# Patient Record
Sex: Male | Born: 1975 | Race: Black or African American | Hispanic: No | Marital: Single | Smoking: Never smoker
Health system: Southern US, Community
[De-identification: ages and names within clinical notes are randomized; demographics above are authoritative.]

## PROBLEM LIST (undated history)

## (undated) DIAGNOSIS — I10 Essential (primary) hypertension: Secondary | ICD-10-CM

---

## 2016-12-17 ENCOUNTER — Encounter (HOSPITAL_COMMUNITY): Payer: Self-pay | Admitting: Family Medicine

## 2016-12-17 ENCOUNTER — Ambulatory Visit (HOSPITAL_COMMUNITY)
Admission: EM | Admit: 2016-12-17 | Discharge: 2016-12-17 | Disposition: A | Discharge status: Home / Self Care | Payer: Self-pay | Attending: Family Medicine | Admitting: Family Medicine

## 2016-12-17 DIAGNOSIS — J069 Acute upper respiratory infection, unspecified: Secondary | ICD-10-CM

## 2016-12-17 DIAGNOSIS — B9789 Other viral agents as the cause of diseases classified elsewhere: Secondary | ICD-10-CM

## 2016-12-17 DIAGNOSIS — K2900 Acute gastritis without bleeding: Secondary | ICD-10-CM

## 2016-12-17 HISTORY — DX: Essential (primary) hypertension: I10

## 2016-12-17 MED ORDER — LOPERAMIDE HCL 2 MG PO CAPS: 2.0000 mg | ORAL_CAPSULE | Freq: Four times a day (QID) | ORAL | 0 refills | Status: AC | PRN

## 2016-12-17 MED ORDER — BENZONATATE 100 MG PO CAPS: 100.0000 mg | ORAL_CAPSULE | Freq: Three times a day (TID) | ORAL | 0 refills | Status: DC

## 2016-12-17 MED ORDER — IPRATROPIUM BROMIDE 0.06 % NA SOLN: 2.0000 | Freq: Four times a day (QID) | NASAL | 12 refills | Status: AC

## 2016-12-17 MED ORDER — ONDANSETRON 4 MG PO TBDP: 4.0000 mg | ORAL_TABLET | Freq: Three times a day (TID) | ORAL | 0 refills | Status: AC | PRN

## 2016-12-17 NOTE — Discharge Instructions (Signed)
You most likely have a viral URI, I advise rest, plenty of fluids and management of symptoms with over the counter medicines. For symptoms you may take Tylenol as needed every 4-6 hours for body aches or fever, not to exceed 4,000 mg a day, Take mucinex or mucinex DM ever 12 hours with a full glass of water, you may use an inhaled steroid such as Flonase, 2 sprays each nostril once a day for congestion, or an antihistamine such as Claritin or Zyrtec once a day.   To help with your symptoms, I have prescribed loperamide for diarrhea, take 1 tablet after each loose stool, not to exceed 4 in any 24 hour period. For Nausea, I have prescribed Zofran, take 1 tablet under the tongue every 8 hours as needed. For cough, I have prescribed a medication called Tessalon. Take 1 tablet every 8 hours as needed for your cough. For runny nose and congestion I have prescribed ipratropium nasal spray, use 2 sprays in each nostril up to 4 times a day as needed.  Should your symptoms worsen or fail to resolve, follow up with your primary care provider or return to clinic.

## 2016-12-17 NOTE — ED Provider Notes (Signed)
CSN: [id number]     Arrival date & time 12/17/16  1000 History   First MD Initiated Contact with Patient 12/17/16 1057     Chief Complaint  Patient presents with  . Cough  . Emesis   (Consider location/radiation/quality/duration/timing/severity/associated sxs/prior Treatment) 41 year old male presents to clinic with chief complaint of cough, congestion, runny nose, nausea, vomiting, and diarrhea. Describes his cough as being a dry, hacking, nonproductive, with clear sputum present for 2 days. Denies history of lung disease such as asthma, COPD, or emphysema. He recently quit smoking approximately 7 days ago. Had previous smoking history of 3 months of one pack per day.  With regard to nausea, vomiting, diarrhea he denies any abdominal pain, reports diarrhea 3-4 episodes this morning, as well as 4-5 episodes yesterday. He describes stools as water-like, no evidence of blood present. He states his vomiting seems to be triggered by his cough, along with some mucus. He reports he still has both his gallbladder, and his appendix. He states his symptoms of nausea and vomiting are unrelated to diet.   The history is provided by the patient.  Cough  Emesis  Associated symptoms: cough     Past Medical History:  Diagnosis Date  . Hypertension    History reviewed. No pertinent surgical history. History reviewed. No pertinent family history. Social History  Substance Use Topics  . Smoking status: Never Smoker  . Smokeless tobacco: Never Used  . Alcohol use Not on file    Review of Systems  Reason unable to perform ROS: as covered in HPI.  Respiratory: Positive for cough.   Gastrointestinal: Positive for vomiting.  All other systems reviewed and are negative.   Allergies  Ibuprofen  Home Medications   Prior to Admission medications   Medication Sig Start Date End Date Taking? Authorizing Provider  benzonatate (TESSALON) 100 MG capsule Take 1 capsule (100 mg total) by mouth every 8  (eight) hours. 12/17/16   Dorena Bodo, NP  ipratropium (ATROVENT) 0.06 % nasal spray Place 2 sprays into both nostrils 4 (four) times daily. 12/17/16   Dorena Bodo, NP  loperamide (IMODIUM) 2 MG capsule Take 1 capsule (2 mg total) by mouth 4 (four) times daily as needed for diarrhea or loose stools. 12/17/16   Dorena Bodo, NP  ondansetron (ZOFRAN ODT) 4 MG disintegrating tablet Take 1 tablet (4 mg total) by mouth every 8 (eight) hours as needed for nausea or vomiting. 12/17/16   Dorena Bodo, NP   Meds Ordered and Administered this Visit  Medications - No data to display  BP 133/81   Pulse 107   Temp 98.7 F (37.1 C)   Resp 18   SpO2 100%  No data found.   Physical Exam  Constitutional: He is oriented to person, place, and time. He appears well-developed and well-nourished. He appears ill. No distress.  HENT:  Head: Normocephalic and atraumatic.  Right Ear: Tympanic membrane and external ear normal.  Left Ear: Tympanic membrane and external ear normal.  Nose: Nose normal. Right sinus exhibits no maxillary sinus tenderness and no frontal sinus tenderness. Left sinus exhibits no maxillary sinus tenderness and no frontal sinus tenderness.  Mouth/Throat: Uvula is midline, oropharynx is clear and moist and mucous membranes are normal. No oropharyngeal exudate. Tonsils are 1+ on the right. Tonsils are 1+ on the left. No tonsillar exudate.  Eyes: Pupils are equal, round, and reactive to light.  Neck: Normal range of motion. Neck supple. No JVD present.  Cardiovascular: Normal rate and  regular rhythm.   Pulmonary/Chest: Effort normal and breath sounds normal. No respiratory distress. He has no wheezes. He has no rhonchi.  Abdominal: Soft. Bowel sounds are normal. There is no hepatosplenomegaly. There is no tenderness. There is no rigidity, no rebound, no guarding, no CVA tenderness, no tenderness at McBurney's point and negative Murphy's sign.  Lymphadenopathy:       Head (right  side): No submental, no submandibular, no tonsillar and no preauricular adenopathy present.       Head (left side): No submental, no submandibular, no tonsillar and no preauricular adenopathy present.    He has no cervical adenopathy.  Neurological: He is alert and oriented to person, place, and time.  Skin: Skin is warm and dry. Capillary refill takes less than 2 seconds. No rash noted. He is not diaphoretic. No erythema.  Psychiatric: He has a normal mood and affect.  Nursing note and vitals reviewed.   Urgent Care Course     Procedures (including critical care time)  Labs Review Labs Reviewed - No data to display  Imaging Review No results found.   Visual Acuity Review  Right Eye Distance:   Left Eye Distance:   Bilateral Distance:    Right Eye Near:   Left Eye Near:    Bilateral Near:         MDM   1. Viral URI with cough   2. Acute gastritis without hemorrhage, unspecified gastritis type    You most likely have a viral URI, I advise rest, plenty of fluids and management of symptoms with over the counter medicines. For symptoms you may take Tylenol as needed every 4-6 hours for body aches or fever, not to exceed 4,000 mg a day, Take mucinex or mucinex DM ever 12 hours with a full glass of water, you may use an inhaled steroid such as Flonase, 2 sprays each nostril once a day for congestion, or an antihistamine such as Claritin or Zyrtec once a day.   To help with your symptoms, I have prescribed loperamide for diarrhea, take 1 tablet after each loose stool, not to exceed 4 in any 24 hour period. For Nausea, I have prescribed Zofran, take 1 tablet under the tongue every 8 hours as needed. For cough, I have prescribed a medication called Tessalon. Take 1 tablet every 8 hours as needed for your cough. For runny nose and congestion I have prescribed ipratropium nasal spray, use 2 sprays in each nostril up to 4 times a day as needed.  Should your symptoms worsen or fail to  resolve, follow up with your primary care provider or return to clinic.       Dorena BodoLawrence Judithe Keetch, NP 12/17/16 1124

## 2016-12-17 NOTE — ED Triage Notes (Signed)
Pt here for cough since Sunday that he treated with mucinex. sts started vomiting and slight diarrhea. sts cold sweats.

## 2020-03-20 ENCOUNTER — Encounter (HOSPITAL_COMMUNITY): Payer: Self-pay

## 2020-03-20 ENCOUNTER — Ambulatory Visit (HOSPITAL_COMMUNITY)
Admission: EM | Admit: 2020-03-20 | Discharge: 2020-03-20 | Disposition: A | Discharge status: Home / Self Care | Payer: Self-pay | Attending: Family Medicine | Admitting: Family Medicine

## 2020-03-20 ENCOUNTER — Other Ambulatory Visit: Payer: Self-pay

## 2020-03-20 DIAGNOSIS — H66002 Acute suppurative otitis media without spontaneous rupture of ear drum, left ear: Secondary | ICD-10-CM

## 2020-03-20 DIAGNOSIS — H6982 Other specified disorders of Eustachian tube, left ear: Secondary | ICD-10-CM

## 2020-03-20 MED ORDER — AMOXICILLIN 500 MG PO CAPS: 500.0000 mg | ORAL_CAPSULE | Freq: Three times a day (TID) | ORAL | 0 refills | Status: AC

## 2020-03-20 MED ORDER — FLUTICASONE PROPIONATE 50 MCG/ACT NA SUSP: 1.0000 | Freq: Every day | NASAL | 0 refills | Status: DC

## 2020-03-20 NOTE — ED Triage Notes (Signed)
Pt presents with left ear fullness X 3 days. 

## 2020-03-20 NOTE — Discharge Instructions (Signed)
Begin amoxicillin for the next week to cover infection. Flonase nasal spray 1 to 2 spray in each nostril daily over the next 1 to 2 weeks to help with draining fluid off of ears contributing to sensation  Please follow-up if any symptoms not improving or worsening

## 2020-03-20 NOTE — ED Provider Notes (Addendum)
[facility]    CSN: [id number] Arrival date & time: 03/20/20  1034      History   Chief Complaint Chief Complaint  Patient presents with  . Ear Fullness    HPI David Huber is a 44 y.o. male history of hypertension presenting today for evaluation of left ear fullness.  Patient notes over the past 3 days he has had decreased hearing and a muffled sensation in his left ear.  He has had some slight discomfort with this with associated mouth sore throat.  Reports history of similar with needing earwax irrigation.  Denies fevers.  Used Mucinex earlier today.  HPI  Past Medical History:  Diagnosis Date  . Hypertension     There are no problems to display for this patient.   History reviewed. No pertinent surgical history.     Home Medications    Prior to Admission medications   Medication Sig Start Date End Date Taking? Authorizing Provider  amoxicillin (AMOXIL) 500 MG capsule Take 1 capsule (500 mg total) by mouth 3 (three) times daily for 7 days. 03/20/20 03/27/20  Coleson Kant C, PA-C  benzonatate (TESSALON) 100 MG capsule Take 1 capsule (100 mg total) by mouth every 8 (eight) hours. 12/17/16   Dorena Bodo, NP  fluticasone (FLONASE) 50 MCG/ACT nasal spray Place 1-2 sprays into both nostrils daily for 7 days. 03/20/20 03/27/20  Keymoni Mccaster C, PA-C  ipratropium (ATROVENT) 0.06 % nasal spray Place 2 sprays into both nostrils 4 (four) times daily. 12/17/16   Dorena Bodo, NP  loperamide (IMODIUM) 2 MG capsule Take 1 capsule (2 mg total) by mouth 4 (four) times daily as needed for diarrhea or loose stools. 12/17/16   Dorena Bodo, NP  ondansetron (ZOFRAN ODT) 4 MG disintegrating tablet Take 1 tablet (4 mg total) by mouth every 8 (eight) hours as needed for nausea or vomiting. 12/17/16   Dorena Bodo, NP    Family History Family History  Family history unknown: Yes    Social History Social History   Tobacco Use  . Smoking status:  Never Smoker  . Smokeless tobacco: Never Used  Substance Use Topics  . Alcohol use: Not on file  . Drug use: Not on file     Allergies   Ibuprofen   Review of Systems Review of Systems  Constitutional: Negative for activity change, appetite change, chills, fatigue and fever.  HENT: Positive for ear pain, hearing loss and sore throat. Negative for congestion, rhinorrhea, sinus pressure and trouble swallowing.   Eyes: Negative for discharge and redness.  Respiratory: Negative for cough, chest tightness and shortness of breath.   Cardiovascular: Negative for chest pain.  Gastrointestinal: Negative for abdominal pain, diarrhea, nausea and vomiting.  Musculoskeletal: Negative for myalgias.  Skin: Negative for rash.  Neurological: Negative for dizziness, light-headedness and headaches.     Physical Exam Triage Vital Signs ED Triage Vitals [03/20/20 1133]  Enc Vitals Group     BP      Pulse      Resp      Temp      Temp src      SpO2      Weight      Height      Head Circumference      Peak Flow      Pain Score 0     Pain Loc      Pain Edu?      Excl. in GC?    No data found.  Updated Vital Signs BP (!) 136/95 (BP Location: Right Arm)   Pulse 93   Temp 98.7 F (37.1 C) (Oral)   Resp 18   SpO2 96%   Visual Acuity Right Eye Distance:   Left Eye Distance:   Bilateral Distance:    Right Eye Near:   Left Eye Near:    Bilateral Near:     Physical Exam Vitals and nursing note reviewed.  Constitutional:      Appearance: He is well-developed.     Comments: No acute distress  HENT:     Head: Normocephalic and atraumatic.     Ears:     Comments: Bilateral canals clear without cerumen, no edema or erythema, left TM appears erythematous, slightly dull with appearance of yellowish fluid behind ear Right TM appears slightly opaque    Nose: Nose normal.     Comments: Nasal mucosa mildly erythematous, nonswollen turbinates    Mouth/Throat:     Comments: Oral  mucosa pink and moist, no tonsillar enlargement or exudate. Posterior pharynx patent and nonerythematous, no uvula deviation or swelling. Normal phonation. Eyes:     Conjunctiva/sclera: Conjunctivae normal.  Cardiovascular:     Rate and Rhythm: Normal rate.  Pulmonary:     Effort: Pulmonary effort is normal. No respiratory distress.     Comments: Breathing comfortably at rest, CTABL, no wheezing, rales or other adventitious sounds auscultated Abdominal:     General: There is no distension.  Musculoskeletal:        General: Normal range of motion.     Cervical back: Neck supple.  Skin:    General: Skin is warm and dry.  Neurological:     Mental Status: He is alert and oriented to person, place, and time.      UC Treatments / Results  Labs (all labs ordered are listed, but only abnormal results are displayed) Labs Reviewed - No data to display  EKG   Radiology No results found.  Procedures Procedures (including critical care time)  Medications Ordered in UC Medications - No data to display  Initial Impression / Assessment and Plan / UC Course  I have reviewed the triage vital signs and the nursing notes.  Pertinent labs & imaging results that were available during my care of the patient were reviewed by me and considered in my medical decision making (see chart for details).     Treating for eustachian dysfunction with likely secondary otitis media developing.  Treating with amoxicillin and Flonase.  No wax needing removal at this point.  Monitor for improvement in symptoms.  Discussed strict return precautions. Patient verbalized understanding and is agreeable with plan.  Final Clinical Impressions(s) / UC Diagnoses   Final diagnoses:  Non-recurrent acute suppurative otitis media of left ear without spontaneous rupture of tympanic membrane  Eustachian tube dysfunction, left     Discharge Instructions     Begin amoxicillin for the next week to cover  infection. Flonase nasal spray 1 to 2 spray in each nostril daily over the next 1 to 2 weeks to help with draining fluid off of ears contributing to sensation  Please follow-up if any symptoms not improving or worsening    ED Prescriptions    Medication Sig Dispense Auth. Provider   amoxicillin (AMOXIL) 500 MG capsule Take 1 capsule (500 mg total) by mouth 3 (three) times daily for 7 days. 21 capsule Anthonio Mizzell C, PA-C   fluticasone (FLONASE) 50 MCG/ACT nasal spray Place 1-2 sprays into both nostrils daily  for 7 days. 1 g Lakethia Coppess, [place] C, PA-C     PDMP not reviewed this encounter.   Lew Dawes, PA-C 03/20/20 [address] [place] C, PA-C 03/20/20 1152

## 2020-12-20 ENCOUNTER — Encounter (HOSPITAL_COMMUNITY): Payer: Self-pay

## 2020-12-20 ENCOUNTER — Ambulatory Visit (HOSPITAL_COMMUNITY)
Admission: EM | Admit: 2020-12-20 | Discharge: 2020-12-20 | Disposition: A | Discharge status: Home / Self Care | Payer: Self-pay | Attending: Family Medicine | Admitting: Family Medicine

## 2020-12-20 ENCOUNTER — Ambulatory Visit (INDEPENDENT_AMBULATORY_CARE_PROVIDER_SITE_OTHER): Payer: Self-pay

## 2020-12-20 ENCOUNTER — Other Ambulatory Visit: Payer: Self-pay

## 2020-12-20 DIAGNOSIS — R0981 Nasal congestion: Secondary | ICD-10-CM

## 2020-12-20 DIAGNOSIS — U099 Post covid-19 condition, unspecified: Secondary | ICD-10-CM

## 2020-12-20 DIAGNOSIS — R0989 Other specified symptoms and signs involving the circulatory and respiratory systems: Secondary | ICD-10-CM

## 2020-12-20 DIAGNOSIS — J029 Acute pharyngitis, unspecified: Secondary | ICD-10-CM

## 2020-12-20 DIAGNOSIS — R059 Cough, unspecified: Secondary | ICD-10-CM

## 2020-12-20 MED ORDER — BENZONATATE 200 MG PO CAPS: 200.0000 mg | ORAL_CAPSULE | Freq: Three times a day (TID) | ORAL | 0 refills | Status: AC | PRN

## 2020-12-20 MED ORDER — FLUTICASONE PROPIONATE 50 MCG/ACT NA SUSP: 2.0000 | Freq: Every day | NASAL | 2 refills | Status: AC

## 2020-12-20 NOTE — ED Provider Notes (Signed)
[facility]    CSN: [id number] Arrival date & time: 12/20/20  1531      History   Chief Complaint Chief Complaint  Patient presents with  . Sore Throat  . Cough    HPI Pranish Kyson Kupper is a 45 y.o. male.   HPI  Cough: Pt reports that for the past 3 days he has had a productive cough with yellow mucous along with a sore throat. He reports that he had negative Covid PCR testing yesterday.  He states that his sore throat mainly occurs at night with coughing.  He has had a temperature around 49 F but has had no other fevers.  He denies any chest pain, significant shortness of breath, abdominal pain.  He states that his main concern is the cough and mucus production causing nasal congestion.  No known sick contacts. He has been taking mucinex for symptoms with relief.  Past Medical History:  Diagnosis Date  . Hypertension     There are no problems to display for this patient.   History reviewed. No pertinent surgical history.    Home Medications    Prior to Admission medications   Medication Sig Start Date End Date Taking? Authorizing Provider  benzonatate (TESSALON) 100 MG capsule Take 1 capsule (100 mg total) by mouth every 8 (eight) hours. 12/17/16   Dorena Bodo, NP  fluticasone (FLONASE) 50 MCG/ACT nasal spray Place 1-2 sprays into both nostrils daily for 7 days. 03/20/20 03/27/20  Wieters, Hallie C, PA-C  ipratropium (ATROVENT) 0.06 % nasal spray Place 2 sprays into both nostrils 4 (four) times daily. 12/17/16   Dorena Bodo, NP  loperamide (IMODIUM) 2 MG capsule Take 1 capsule (2 mg total) by mouth 4 (four) times daily as needed for diarrhea or loose stools. 12/17/16   Dorena Bodo, NP  ondansetron (ZOFRAN ODT) 4 MG disintegrating tablet Take 1 tablet (4 mg total) by mouth every 8 (eight) hours as needed for nausea or vomiting. 12/17/16   Dorena Bodo, NP    Family History Family History  Family history unknown: Yes    Social  History Social History   Tobacco Use  . Smoking status: Never Smoker  . Smokeless tobacco: Never Used     Allergies   Ibuprofen   Review of Systems Review of Systems  As stated above in HPI Physical Exam Triage Vital Signs ED Triage Vitals  Enc Vitals Group     BP 12/20/20 1543 (!) 154/92     Pulse Rate 12/20/20 1543 (!) 115     Resp 12/20/20 1543 17     Temp 12/20/20 1543 98.8 F (37.1 C)     Temp Source 12/20/20 1543 Oral     SpO2 12/20/20 1543 98 %     Weight --      Height --      Head Circumference --      Peak Flow --      Pain Score 12/20/20 1541 6     Pain Loc --      Pain Edu? --      Excl. in GC? --    No data found.  Updated Vital Signs BP (!) 154/92 (BP Location: Left Arm)   Pulse (!) 115   Temp 98.8 F (37.1 C) (Oral)   Resp 17   SpO2 98%   Physical Exam Vitals and nursing note reviewed.  Constitutional:      General: He is not in acute distress.    Appearance: He  is well-developed. He is not ill-appearing, toxic-appearing or diaphoretic.  HENT:     Right Ear: Tympanic membrane normal.     Left Ear: Tympanic membrane normal.     Nose: Congestion present. No rhinorrhea.     Mouth/Throat:     Mouth: Mucous membranes are moist.     Pharynx: Uvula midline. Uvula swelling present. No pharyngeal swelling, oropharyngeal exudate or posterior oropharyngeal erythema.     Tonsils: No tonsillar exudate or tonsillar abscesses.  Eyes:     Conjunctiva/sclera: Conjunctivae normal.     Pupils: Pupils are equal, round, and reactive to light.  Cardiovascular:     Rate and Rhythm: Normal rate and regular rhythm.     Heart sounds: Normal heart sounds.  Pulmonary:     Effort: Pulmonary effort is normal. No tachypnea or bradypnea.     Breath sounds: Decreased air movement present. No stridor. Examination of the right-upper field reveals decreased breath sounds. Examination of the right-middle field reveals decreased breath sounds. Examination of the  right-lower field reveals decreased breath sounds. Decreased breath sounds present. No wheezing, rhonchi or rales.  Musculoskeletal:     Cervical back: Normal range of motion.  Lymphadenopathy:     Cervical: No cervical adenopathy.  Neurological:     Mental Status: He is alert.      UC Treatments / Results  Labs (all labs ordered are listed, but only abnormal results are displayed) Labs Reviewed - No data to display  EKG   Radiology No results found.  Procedures Procedures (including critical care time)  Medications Ordered in UC Medications - No data to display  Initial Impression / Assessment and Plan / UC Course  I have reviewed the triage vital signs and the nursing notes.  Pertinent labs & imaging results that were available during my care of the patient were reviewed by me and considered in my medical decision making (see chart for details).     New. Chest x ray pending.   Update: Normal chest x ray. Will send in a higher dose of tessalon along with refill his flonase.  Red flag signs and symptoms discussed.  Follow-up as needed. Final Clinical Impressions(s) / UC Diagnoses   Final diagnoses:  None   Discharge Instructions   None    ED Prescriptions    None     PDMP not reviewed this encounter.   Rushie Chestnut, [place] 12/20/20 1718

## 2020-12-20 NOTE — ED Triage Notes (Signed)
Pt presents with a sore throat and cough x 3 days. Pt states when he lays down he coughs alot and spits up mucus. Pt states he feels SOB and states he was COVID tested today at a mobile testing site. He states it resulted negative.

## 2021-09-08 IMAGING — DX DG CHEST 2V
2 series · 2 of 2 positions shown · non-contrast
Comparison: None.

CLINICAL DATA: Productive cough following COVID, recent sore
throat, upper respiratory drainage

EXAM:
CHEST - 2 VIEW

[chest pa]
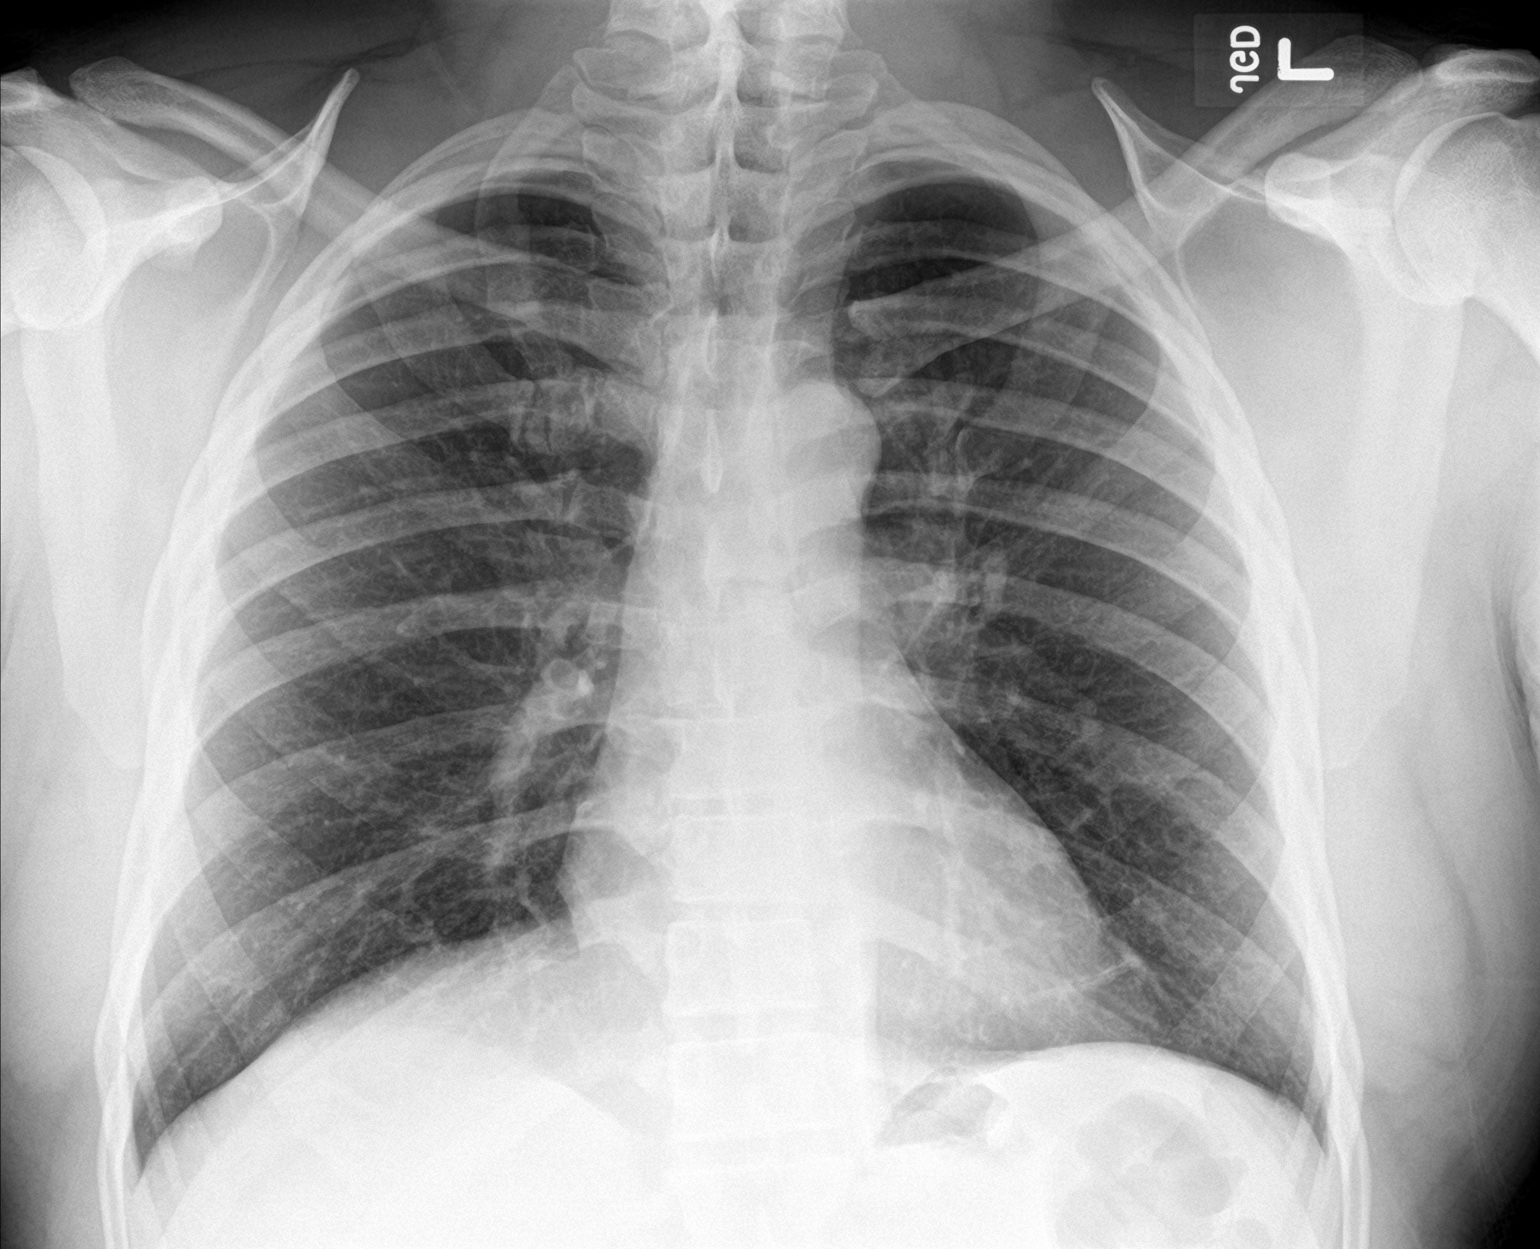

[chest lat]
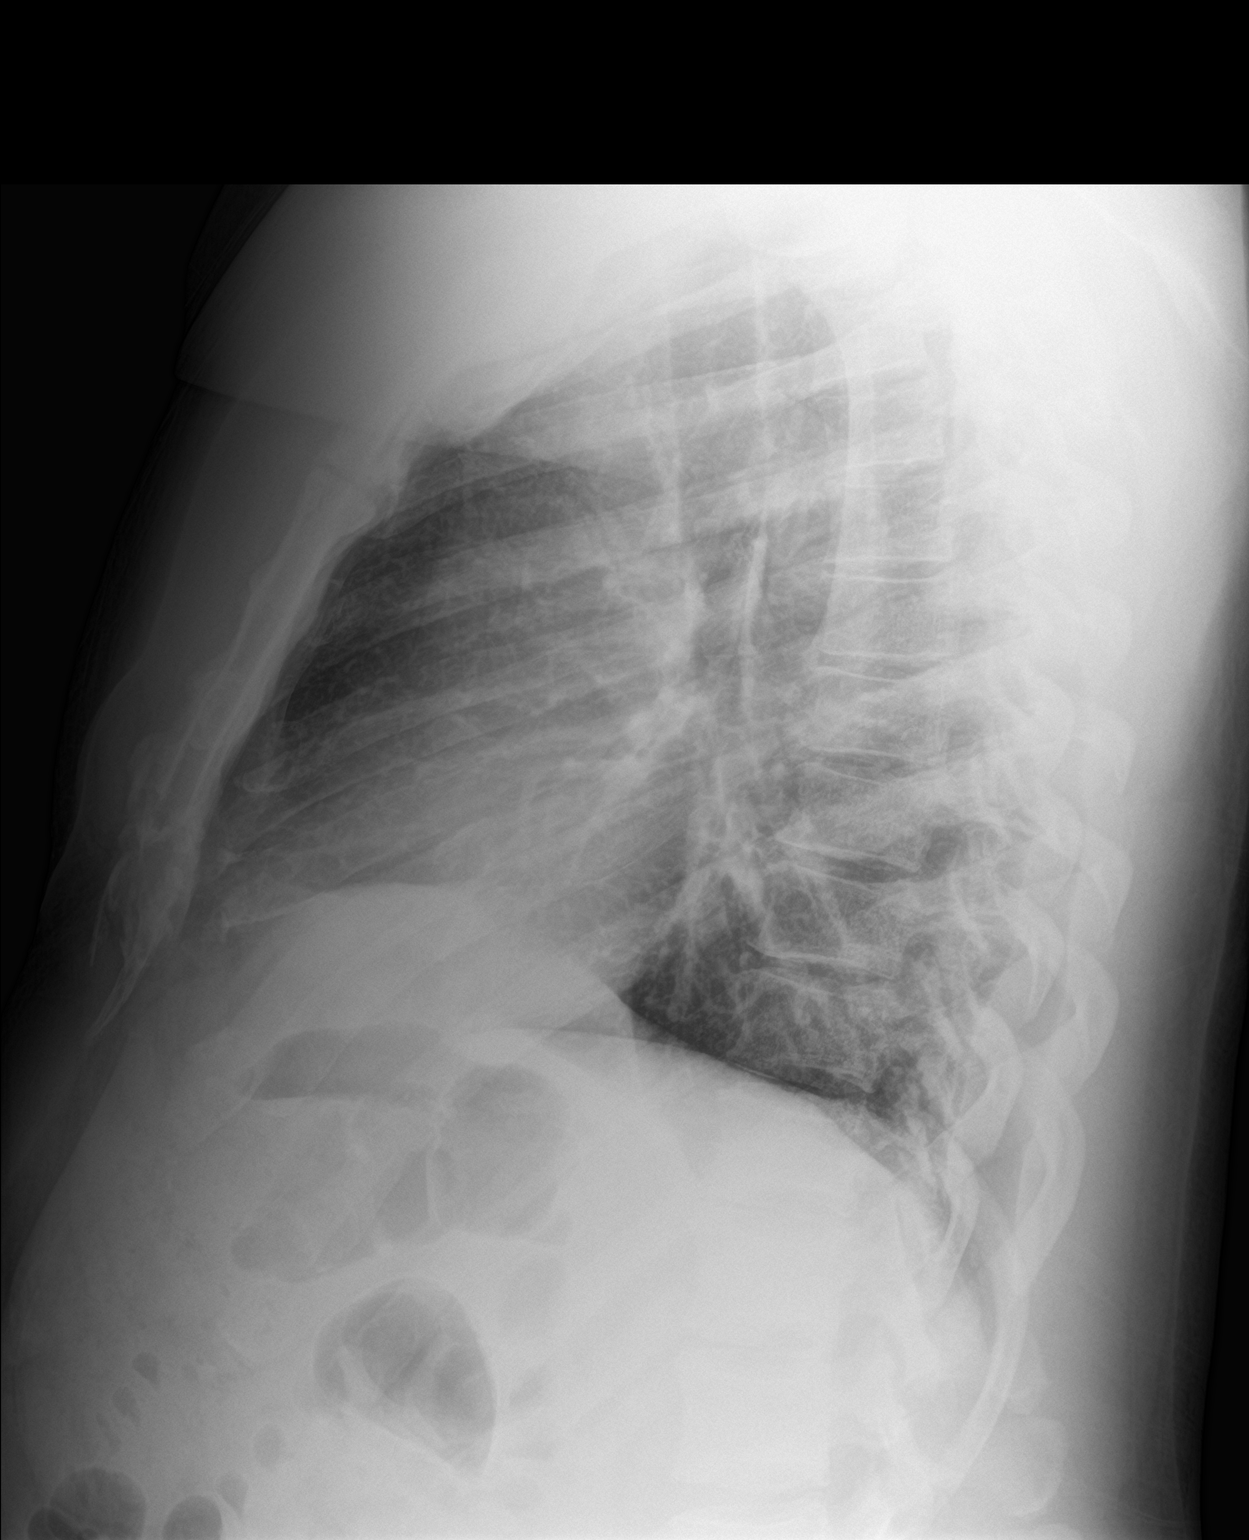

[2 of 2 positions shown; findings below may reference images not displayed]

FINDINGS: The heart size and mediastinal contours are within normal limits.
Both lungs are clear. The visualized skeletal structures are
unremarkable. Cervical ribs noted. Trachea midline.
IMPRESSION: No active cardiopulmonary disease.
